# Patient Record
Sex: Female | Born: 1975 | Race: White | Hispanic: No | Marital: Married | Smoking: Never smoker
Health system: Southern US, Community
[De-identification: ages and names within clinical notes are randomized; demographics above are authoritative.]

## PROBLEM LIST (undated history)

## (undated) DIAGNOSIS — K589 Irritable bowel syndrome without diarrhea: Secondary | ICD-10-CM

## (undated) DIAGNOSIS — F419 Anxiety disorder, unspecified: Secondary | ICD-10-CM

## (undated) HISTORY — PX: NO PAST SURGERIES: SHX2092

---

## 1998-01-16 ENCOUNTER — Other Ambulatory Visit: Admission: RE | Admit: 1998-01-16 | Discharge: 1998-01-16 | Payer: Self-pay | Admitting: Gynecology

## 1999-01-29 ENCOUNTER — Other Ambulatory Visit: Admission: RE | Admit: 1999-01-29 | Discharge: 1999-01-29 | Payer: Self-pay | Admitting: Gynecology

## 2000-11-23 ENCOUNTER — Inpatient Hospital Stay (HOSPITAL_COMMUNITY): Admission: AD | Admit: 2000-11-23 | Discharge: 2000-11-23 | Payer: Self-pay | Admitting: Obstetrics and Gynecology

## 2001-02-11 ENCOUNTER — Inpatient Hospital Stay (HOSPITAL_COMMUNITY): Admission: AD | Admit: 2001-02-11 | Discharge: 2001-02-14 | Payer: Self-pay | Admitting: Obstetrics and Gynecology

## 2001-02-15 ENCOUNTER — Encounter: Admission: RE | Admit: 2001-02-15 | Discharge: 2001-03-17 | Payer: Self-pay | Admitting: Obstetrics and Gynecology

## 2001-03-18 ENCOUNTER — Other Ambulatory Visit: Admission: RE | Admit: 2001-03-18 | Discharge: 2001-03-18 | Payer: Self-pay | Admitting: Obstetrics and Gynecology

## 2002-02-14 ENCOUNTER — Other Ambulatory Visit: Admission: RE | Admit: 2002-02-14 | Discharge: 2002-02-14 | Payer: Self-pay | Admitting: Obstetrics and Gynecology

## 2003-06-02 ENCOUNTER — Other Ambulatory Visit: Admission: RE | Admit: 2003-06-02 | Discharge: 2003-06-02 | Payer: Self-pay | Admitting: Obstetrics and Gynecology

## 2004-07-26 ENCOUNTER — Other Ambulatory Visit: Admission: RE | Admit: 2004-07-26 | Discharge: 2004-07-26 | Payer: Self-pay | Admitting: Obstetrics and Gynecology

## 2006-09-10 ENCOUNTER — Inpatient Hospital Stay (HOSPITAL_COMMUNITY): Admission: AD | Admit: 2006-09-10 | Discharge: 2006-09-12 | Payer: Self-pay | Admitting: Obstetrics and Gynecology

## 2011-01-23 LAB — ABO/RH: RH Type: POSITIVE

## 2011-01-23 LAB — ANTIBODY SCREEN: Antibody Screen: NEGATIVE

## 2011-01-23 LAB — HIV ANTIBODY (ROUTINE TESTING W REFLEX): HIV: NONREACTIVE

## 2011-02-08 ENCOUNTER — Encounter (HOSPITAL_COMMUNITY): Payer: Self-pay

## 2011-02-08 ENCOUNTER — Inpatient Hospital Stay (HOSPITAL_COMMUNITY)
Admission: AD | Admit: 2011-02-08 | Discharge: 2011-02-08 | Disposition: A | Discharge status: Home / Self Care | Payer: 59 | Source: Ambulatory Visit | Attending: Obstetrics and Gynecology | Admitting: Obstetrics and Gynecology

## 2011-02-08 DIAGNOSIS — O209 Hemorrhage in early pregnancy, unspecified: Secondary | ICD-10-CM | POA: Insufficient documentation

## 2011-02-08 DIAGNOSIS — O4690 Antepartum hemorrhage, unspecified, unspecified trimester: Secondary | ICD-10-CM

## 2011-02-08 LAB — ABO/RH: ABO/RH(D): A POS

## 2011-02-08 NOTE — ED Provider Notes (Signed)
Chief Complaint:  Vaginal Bleeding   Anita Oliver is  35 y.o. G1P0.  No LMP recorded. Patient is pregnant.. [redacted]w[redacted]d  Her pregnancy status is positive.  She presents complaining of Vaginal Bleeding  Onset is described as sudden at 3 am today, none since. Denies cramping or pain   Obstetrical/Gynecological History: OB History    Grav Para Term Preterm Abortions TAB SAB Ect Mult Living   1               Past Medical History: No past medical history on file.  Past Surgical History: No past surgical history on file.  Family History: No family history on file.  Social History: History  Substance Use Topics  . Smoking status: Not on file  . Smokeless tobacco: Not on file  . Alcohol Use: Not on file    Allergies:  Allergies  Allergen Reactions  . Codeine Hives    Childhood reaction    Prescriptions prior to admission  Medication Sig Dispense Refill  . acetaminophen (TYLENOL) 500 MG tablet Take 1,000 mg by mouth every 6 (six) hours as needed. For migraine       . ALPRAZolam (XANAX) 0.25 MG tablet Take 0.125 mg by mouth once. 1/2 tab for anxiety      . prenatal vitamin w/FE, FA (PRENATAL 1 + 1) 27-1 MG TABS Take 1 tablet by mouth at bedtime.          Review of Systems - Negative except what has been reviewed in the HPI  Physical Exam   Blood pressure 114/86, pulse 101, temperature 98.5 F (36.9 C), temperature source Oral, resp. rate 16, height 5\' 8"  (1.727 m), weight 99.519 kg (219 lb 6.4 oz).  General: General appearance - alert, well appearing, and in no distress, oriented to person, place, and time and overweight Mental status - alert, oriented to person, place, and time, normal mood, behavior, speech, dress, motor activity, and thought processes, affect appropriate to mood Abdomen - soft, nontender, nondistended, no masses or organomegaly Focused Gynecological Exam: VULVA: normal appearing vulva with no masses, tenderness or lesions, VAGINA: normal appearing  vagina with normal color and discharge, no lesions, scant blood in vault, CERVIX: normal appearing cervix without discharge or lesions, closed  MD consult: d/w Dr. Janee Morn, No formal US at this time. D/C home with bleeding precautions. FU in office next week. Call on-call provider if anymore heavy bleeding over wknd.  Labs: No results found for this or any previous visit (from the past 24 hour(s)). Imaging Studies:  Informal bedside US. Viable IUP. Probable large [facility]   Assessment: Bleeding during Pregnancy  Plan: Discharge home FU next week in office  SHORES,SUZANNE E. 02/08/2011,1:13 PM  Per Maylon Cos, pt's blood type checked prior to d/c.  She is A+.

## 2011-02-08 NOTE — Progress Notes (Signed)
Onset of vaginal bleeding this morning gush of blood, had [place] about 6 weeks, denies cramping.

## 2011-05-13 NOTE — L&D Delivery Note (Signed)
Pt rapidly progressed to C/C/+2. The second stage only lasted 5 mins. She had a SVD of one live viable white female infant over intact perineum in ROA position. Placenta S/I. EBL-400cc. Baby to NBN. No complications.

## 2011-07-29 LAB — STREP B DNA PROBE: GBS: NEGATIVE

## 2011-08-16 ENCOUNTER — Inpatient Hospital Stay (HOSPITAL_COMMUNITY)
Admission: AD | Admit: 2011-08-16 | Discharge: 2011-08-18 | DRG: 775 | Disposition: A | Discharge status: Home / Self Care | Payer: 59 | Source: Ambulatory Visit | Attending: Obstetrics and Gynecology | Admitting: Obstetrics and Gynecology

## 2011-08-16 ENCOUNTER — Encounter (HOSPITAL_COMMUNITY): Payer: Self-pay | Admitting: Anesthesiology

## 2011-08-16 ENCOUNTER — Inpatient Hospital Stay (HOSPITAL_COMMUNITY): Payer: 59 | Admitting: Anesthesiology

## 2011-08-16 ENCOUNTER — Encounter (HOSPITAL_COMMUNITY): Payer: Self-pay | Admitting: *Deleted

## 2011-08-16 HISTORY — DX: Anxiety disorder, unspecified: F41.9

## 2011-08-16 HISTORY — DX: Irritable bowel syndrome, unspecified: K58.9

## 2011-08-16 LAB — RPR: RPR Ser Ql: NONREACTIVE

## 2011-08-16 LAB — CBC
HCT: 39.1 % (ref 36.0–46.0)
Hemoglobin: 13.3 g/dL (ref 12.0–15.0)
MCH: 30.8 pg (ref 26.0–34.0)
MCHC: 34 g/dL (ref 30.0–36.0)
RBC: 4.32 MIL/uL (ref 3.87–5.11)

## 2011-08-16 MED ORDER — LACTATED RINGERS IV SOLN
500.0000 mL | INTRAVENOUS | Status: DC | PRN
  Administered 2011-08-16: 1000 mL via INTRAVENOUS

## 2011-08-16 MED ORDER — ACETAMINOPHEN 325 MG PO TABS: 650.0000 mg | ORAL_TABLET | ORAL | Status: DC | PRN

## 2011-08-16 MED ORDER — FENTANYL 2.5 MCG/ML BUPIVACAINE 1/10 % EPIDURAL INFUSION (WH - ANES)
INTRAMUSCULAR | Status: DC | PRN
  Administered 2011-08-16: 14 mL/h via EPIDURAL

## 2011-08-16 MED ORDER — LIDOCAINE HCL (PF) 1 % IJ SOLN
INTRAMUSCULAR | Status: DC | PRN
  Administered 2011-08-16 (×2): 4 mL

## 2011-08-16 MED ORDER — ZOLPIDEM TARTRATE 5 MG PO TABS: 5.0000 mg | ORAL_TABLET | Freq: Every evening | ORAL | Status: DC | PRN

## 2011-08-16 MED ORDER — LACTATED RINGERS IV SOLN
INTRAVENOUS | Status: DC
  Administered 2011-08-16 (×2): via INTRAVENOUS

## 2011-08-16 MED ORDER — EPHEDRINE 5 MG/ML INJ
10.0000 mg | INTRAVENOUS | Status: DC | PRN
  Filled 2011-08-16: qty 4

## 2011-08-16 MED ORDER — LACTATED RINGERS IV SOLN: 500.0000 mL | Freq: Once | INTRAVENOUS | Status: DC

## 2011-08-16 MED ORDER — CITRIC ACID-SODIUM CITRATE 334-500 MG/5ML PO SOLN: 30.0000 mL | ORAL | Status: DC | PRN

## 2011-08-16 MED ORDER — SIMETHICONE 80 MG PO CHEW: 80.0000 mg | CHEWABLE_TABLET | ORAL | Status: DC | PRN

## 2011-08-16 MED ORDER — OXYTOCIN 20 UNITS IN LACTATED RINGERS INFUSION - SIMPLE
125.0000 mL/h | Freq: Once | INTRAVENOUS | Status: AC
  Administered 2011-08-16: 125 mL/h via INTRAVENOUS

## 2011-08-16 MED ORDER — FLEET ENEMA 7-19 GM/118ML RE ENEM: 1.0000 | ENEMA | RECTAL | Status: DC | PRN

## 2011-08-16 MED ORDER — TETANUS-DIPHTH-ACELL PERTUSSIS 5-2.5-18.5 LF-MCG/0.5 IM SUSP: 0.5000 mL | Freq: Once | INTRAMUSCULAR | Status: DC

## 2011-08-16 MED ORDER — ONDANSETRON HCL 4 MG PO TABS: 4.0000 mg | ORAL_TABLET | ORAL | Status: DC | PRN

## 2011-08-16 MED ORDER — MEASLES, MUMPS & RUBELLA VAC ~~LOC~~ INJ
0.5000 mL | INJECTION | Freq: Once | SUBCUTANEOUS | Status: DC
  Filled 2011-08-16: qty 0.5

## 2011-08-16 MED ORDER — OXYTOCIN BOLUS FROM INFUSION
500.0000 mL | Freq: Once | INTRAVENOUS | Status: DC
  Filled 2011-08-16: qty 1000
  Filled 2011-08-16: qty 500

## 2011-08-16 MED ORDER — FENTANYL 2.5 MCG/ML BUPIVACAINE 1/10 % EPIDURAL INFUSION (WH - ANES)
14.0000 mL/h | INTRAMUSCULAR | Status: DC
  Filled 2011-08-16: qty 60

## 2011-08-16 MED ORDER — DIPHENHYDRAMINE HCL 50 MG/ML IJ SOLN: 12.5000 mg | INTRAMUSCULAR | Status: DC | PRN

## 2011-08-16 MED ORDER — IBUPROFEN 600 MG PO TABS: 600.0000 mg | ORAL_TABLET | Freq: Four times a day (QID) | ORAL | Status: DC | PRN

## 2011-08-16 MED ORDER — EPHEDRINE 5 MG/ML INJ: 10.0000 mg | INTRAVENOUS | Status: DC | PRN

## 2011-08-16 MED ORDER — WITCH HAZEL-GLYCERIN EX PADS: 1.0000 "application " | MEDICATED_PAD | CUTANEOUS | Status: DC | PRN

## 2011-08-16 MED ORDER — PHENYLEPHRINE 40 MCG/ML (10ML) SYRINGE FOR IV PUSH (FOR BLOOD PRESSURE SUPPORT)
80.0000 ug | PREFILLED_SYRINGE | INTRAVENOUS | Status: AC | PRN
  Administered 2011-08-16: 80 ug via INTRAVENOUS
  Administered 2011-08-16 (×2): 120 ug via INTRAVENOUS
  Filled 2011-08-16 (×2): qty 5
  Filled 2011-08-16: qty 10

## 2011-08-16 MED ORDER — PHENYLEPHRINE 40 MCG/ML (10ML) SYRINGE FOR IV PUSH (FOR BLOOD PRESSURE SUPPORT)
80.0000 ug | PREFILLED_SYRINGE | INTRAVENOUS | Status: AC | PRN
  Administered 2011-08-16 (×3): 80 ug via INTRAVENOUS

## 2011-08-16 MED ORDER — ONDANSETRON HCL 4 MG/2ML IJ SOLN: 4.0000 mg | INTRAMUSCULAR | Status: DC | PRN

## 2011-08-16 MED ORDER — ONDANSETRON HCL 4 MG/2ML IJ SOLN: 4.0000 mg | Freq: Four times a day (QID) | INTRAMUSCULAR | Status: DC | PRN

## 2011-08-16 MED ORDER — DIBUCAINE 1 % RE OINT: 1.0000 "application " | TOPICAL_OINTMENT | RECTAL | Status: DC | PRN

## 2011-08-16 MED ORDER — OXYCODONE-ACETAMINOPHEN 5-325 MG PO TABS: 1.0000 | ORAL_TABLET | ORAL | Status: DC | PRN

## 2011-08-16 MED ORDER — IBUPROFEN 600 MG PO TABS
600.0000 mg | ORAL_TABLET | Freq: Four times a day (QID) | ORAL | Status: DC
  Administered 2011-08-16 – 2011-08-18 (×7): 600 mg via ORAL
  Filled 2011-08-16 (×7): qty 1

## 2011-08-16 MED ORDER — BENZOCAINE-MENTHOL 20-0.5 % EX AERO: 1.0000 "application " | INHALATION_SPRAY | CUTANEOUS | Status: DC | PRN

## 2011-08-16 MED ORDER — LIDOCAINE HCL (PF) 1 % IJ SOLN: 30.0000 mL | INTRAMUSCULAR | Status: DC | PRN

## 2011-08-16 NOTE — Anesthesia Postprocedure Evaluation (Signed)
  Anesthesia Post-op Note  Patient: Anita Oliver  Procedure(s) Performed: * No procedures listed *  Patient Location: PACU and Mother/Baby  Anesthesia Type: Epidural  Level of Consciousness: awake, alert  and oriented  Airway and Oxygen Therapy: Patient Spontanous Breathing   Post-op Assessment: Patient's Cardiovascular Status Stable and Respiratory Function Stable  Post-op Vital Signs: stable  Complications: No apparent anesthesia complications

## 2011-08-16 NOTE — MAU Note (Signed)
[employer] fund[employer] to place IV. Pt jerked back each time and began to have panic attack. Pt decided she would labor without epidural as she had fast labor last delivery. Will take to [facility] and give pt time to try to relax.

## 2011-08-16 NOTE — MAU Note (Signed)
Leaking fld since 0430. Contractions started about the same time.

## 2011-08-16 NOTE — Progress Notes (Signed)
Epidural paused due to pt low BPs and fetus not tolerating well with prolonged decels

## 2011-08-16 NOTE — Anesthesia Preprocedure Evaluation (Signed)
Anesthesia Evaluation  Patient identified by MRN, date of birth, ID band Patient awake    Reviewed: Allergy & Precautions, H&P , Patient's Chart, lab work & pertinent test results  Airway Mallampati: III TM Distance: >3 FB Neck ROM: full    Dental No notable dental hx. (+) Teeth Intact   Pulmonary neg pulmonary ROS,  breath sounds clear to auscultation  Pulmonary exam normal       Cardiovascular negative cardio ROS  Rhythm:regular Rate:Normal     Neuro/Psych Anxietynegative neurological ROS     GI/Hepatic negative GI ROS, Neg liver ROS, IBS    Endo/Other  Morbid obesity  Renal/GU negative Renal ROS  negative genitourinary   Musculoskeletal   Abdominal Normal abdominal exam  (+)   Peds  Hematology negative hematology ROS (+)   Anesthesia Other Findings   Reproductive/Obstetrics (+) Pregnancy                           Anesthesia Physical Anesthesia Plan  ASA: III  Anesthesia Plan: Epidural   Post-op Pain Management:    Induction:   Airway Management Planned:   Additional Equipment:   Intra-op Plan:   Post-operative Plan:   Informed Consent: I have reviewed the patients History and Physical, chart, labs and discussed the procedure including the risks, benefits and alternatives for the proposed anesthesia with the patient or authorized representative who has indicated his/her understanding and acceptance.     Plan Discussed with: Anesthesiologist  Anesthesia Plan Comments:         Anesthesia Quick Evaluation

## 2011-08-16 NOTE — Progress Notes (Signed)
Dr Tenny Craw notified of pt's admission and status. Aware of SROM at 0430, sl yellow fld, fhr reassuring but not reactive yet

## 2011-08-16 NOTE — Progress Notes (Signed)
Anesthesia at bedside--epidural restarted 

## 2011-08-16 NOTE — Anesthesia Procedure Notes (Signed)
Epidural Patient location during procedure: OB Start time: 08/16/2011 8:54 AM  Staffing Anesthesiologist: Harlowe Dowler A. Performed by: anesthesiologist   Preanesthetic Checklist Completed: patient identified, site marked, surgical consent, pre-op evaluation, timeout performed, IV checked, risks and benefits discussed and monitors and equipment checked  Epidural Patient position: sitting Prep: site prepped and draped and DuraPrep Patient monitoring: continuous pulse ox and blood pressure Approach: midline Injection technique: LOR air  Needle:  Needle type: Tuohy  Needle gauge: 17 G Needle length: 9 cm Needle insertion depth: 6 cm Catheter type: closed end flexible Catheter size: 19 Gauge Catheter at skin depth: 11 cm Test dose: negative and Other  Assessment Events: blood not aspirated, injection not painful, no injection resistance, negative IV test and no paresthesia  Additional Notes Patient identified. Risks and benefits discussed including failed block, incomplete  Pain control, post dural puncture headache, nerve damage, paralysis, blood pressure Changes, nausea, vomiting, reactions to medications-both toxic and allergic and post Partum back pain. All questions were answered. Patient expressed understanding and wished to proceed. Sterile technique was used throughout procedure. Epidural site was Dressed with sterile barrier dressing. No paresthesias, signs of intravascular injection Or signs of intrathecal spread were encountered.  Patient was more comfortable after the epidural was dosed. Please see RN's note for documentation of vital signs and FHR which are stable.

## 2011-08-16 NOTE — MAU Note (Signed)
Report called to Christy RN in BS 

## 2011-08-16 NOTE — H&P (Signed)
Pt is a 36 year old white female, G3P2002 at term who presents to L&D because of SROM. PNC was uncomplicated. Pt with +pool in ER. PMHx: see hollister PE: obese white female in NAD Abd- gravid, palp contractions Cx- 50/4/-2 / vtx per nurse. IMP/ IUP in labor Plan/ admit.

## 2011-08-17 LAB — CBC
HCT: 32.9 % — ABNORMAL LOW (ref 36.0–46.0)
Hemoglobin: 10.9 g/dL — ABNORMAL LOW (ref 12.0–15.0)
MCHC: 33.1 g/dL (ref 30.0–36.0)
WBC: 13.8 10*3/uL — ABNORMAL HIGH (ref 4.0–10.5)

## 2011-08-17 NOTE — Progress Notes (Signed)
PPD#1 Pt without c/o. Lochia-wnl. Wants circ. VSSAF Abd-soft IMP/ doing well Plan/ routine care.

## 2011-08-17 NOTE — Progress Notes (Signed)
Received referral for LCSW consult for history of anxiety and postpartum depression.  Care provider has given Rx for Xanax for anxiety management.  Care team encouraged to follow up with LCSW for services if needed.  Staci Acosta, LCSW 08/17/2011, 10:02 am

## 2011-08-18 MED ORDER — IBUPROFEN 600 MG PO TABS: 600.0000 mg | ORAL_TABLET | Freq: Four times a day (QID) | ORAL | Status: AC

## 2011-08-18 NOTE — Discharge Summary (Signed)
Obstetric Discharge Summary Reason for Admission: rupture of membranes Prenatal Procedures: none Intrapartum Procedures: spontaneous vaginal delivery Postpartum Procedures: none Complications-Operative and Postpartum: none Hemoglobin  Date Value Range Status  08/17/2011 10.9* 12.0-15.0 (g/dL) Final     DELTA CHECK NOTED     REPEATED TO VERIFY     HCT  Date Value Range Status  08/17/2011 32.9* 36.0-46.0 (%) Final    Physical Exam:  General: alert and cooperative Lochia: appropriate Uterine Fundus: firm  DVT Evaluation: No evidence of DVT seen on physical exam.  Discharge Diagnoses: Term Pregnancy-delivered  Discharge Information: Date: 08/18/2011 Activity: pelvic rest Diet: routine Medications: PNV and Ibuprofen Condition: stable Instructions: refer to practice specific booklet Discharge to: home Follow-up Information    Follow up with Levi Aland, MD in 4 weeks.   Contact information:   [address] [place] [address] [phone]          Newborn Data:   Kamiah, Fite [[id number]]  Live born female  Birth Weight:  APGAR: ,    Princesa, Willig [[id number]]  Live born female  Birth Weight: 9 lb 4.2 oz (4200 g) APGAR: 8, 9  Home with mother.  Philip Aspen 08/18/2011, 8:41 AM

## 2014-03-13 ENCOUNTER — Encounter (HOSPITAL_COMMUNITY): Payer: Self-pay | Admitting: *Deleted

## 2015-04-25 ENCOUNTER — Other Ambulatory Visit: Payer: Self-pay | Admitting: General Surgery

## 2015-05-21 ENCOUNTER — Encounter (HOSPITAL_COMMUNITY): Payer: Self-pay

## 2015-05-21 ENCOUNTER — Encounter (HOSPITAL_COMMUNITY)
Admission: RE | Admit: 2015-05-21 | Discharge: 2015-05-21 | Disposition: A | Discharge status: Home / Self Care | Payer: 59 | Source: Ambulatory Visit | Attending: General Surgery | Admitting: General Surgery

## 2015-05-21 DIAGNOSIS — K8010 Calculus of gallbladder with chronic cholecystitis without obstruction: Secondary | ICD-10-CM | POA: Diagnosis not present

## 2015-05-21 DIAGNOSIS — Z79899 Other long term (current) drug therapy: Secondary | ICD-10-CM | POA: Diagnosis not present

## 2015-05-21 DIAGNOSIS — K8020 Calculus of gallbladder without cholecystitis without obstruction: Secondary | ICD-10-CM | POA: Diagnosis present

## 2015-05-21 DIAGNOSIS — F419 Anxiety disorder, unspecified: Secondary | ICD-10-CM | POA: Diagnosis not present

## 2015-05-21 LAB — CBC
HCT: 40 % (ref 36.0–46.0)
HEMOGLOBIN: 13.1 g/dL (ref 12.0–15.0)
MCH: 29.6 pg (ref 26.0–34.0)
MCHC: 32.8 g/dL (ref 30.0–36.0)
MCV: 90.3 fL (ref 78.0–100.0)
Platelets: 309 10*3/uL (ref 150–400)
RBC: 4.43 MIL/uL (ref 3.87–5.11)
RDW: 13.4 % (ref 11.5–15.5)
WBC: 6.6 10*3/uL (ref 4.0–10.5)

## 2015-05-21 LAB — COMPREHENSIVE METABOLIC PANEL
ALK PHOS: 48 U/L (ref 38–126)
ALT: 14 U/L (ref 14–54)
ANION GAP: 8 (ref 5–15)
AST: 18 U/L (ref 15–41)
Albumin: 3.6 g/dL (ref 3.5–5.0)
BILIRUBIN TOTAL: 0.4 mg/dL (ref 0.3–1.2)
BUN: 8 mg/dL (ref 6–20)
CALCIUM: 9.3 mg/dL (ref 8.9–10.3)
CO2: 23 mmol/L (ref 22–32)
Chloride: 107 mmol/L (ref 101–111)
Creatinine, Ser: 0.83 mg/dL (ref 0.44–1.00)
Glucose, Bld: 85 mg/dL (ref 65–99)
POTASSIUM: 3.9 mmol/L (ref 3.5–5.1)
Sodium: 138 mmol/L (ref 135–145)
TOTAL PROTEIN: 6.7 g/dL (ref 6.5–8.1)

## 2015-05-21 LAB — HCG, SERUM, QUALITATIVE: PREG SERUM: NEGATIVE

## 2015-05-21 NOTE — Pre-Procedure Instructions (Signed)
Dianne DunCarrie M Leever  05/21/2015      [facility] - [place], [place] - [address] [facility] [id number] Phone: [phone] Fax: [phone]    Your procedure is scheduled on   Thursday  05/24/15  [facility] at 530 A.M.  Call this number if you have problems the morning of surgery:  [phone]   Remember:  Do not eat food or drink liquids after midnight.  Take these medicines the morning of surgery with A SIP OF WATER   ALBUTEROL, ALPRAZOLAM IF NEEDED, KURVELO, SERTRALINE  (STOP ASPIRIN, COUMADIN, PLAVIX, EFFIENT, HERBAL MEDICINES, VITAMINS)   Do not wear jewelry, make-up or nail polish.  Do not wear lotions, powders, or perfumes.  You may wear deodorant.  Do not shave 48 hours prior to surgery.  Men may shave face and neck.  Do not bring valuables to the hospital.  [facility] is not responsible for any belongings or valuables.  Contacts, dentures or bridgework may not be worn into surgery.  Leave your suitcase in the car.  After surgery it may be brought to your room.  For patients admitted to the hospital, discharge time will be determined by your treatment team.  Patients discharged the day of surgery will not be allowed to drive home.   Name and phone number of your driver:    Special instructions:  [facility] - Preparing for Surgery  Before surgery, you can play an important role.  Because skin is not sterile, your skin needs to be as free of germs as possible.  You can reduce the number of germs on you skin by washing with CHG (chlorahexidine gluconate) soap before surgery.  CHG is an antiseptic cleaner which kills germs and bonds with the skin to continue killing germs even after washing.  Please DO NOT use if you have an allergy to CHG or antibacterial soaps.  If your skin becomes reddened/irritated stop using the CHG and inform your nurse when you arrive at Short Stay.  Do not shave (including legs  and underarms) for at least 48 hours prior to the first CHG shower.  You may shave your face.  Please follow these instructions carefully:   1.  Shower with CHG Soap the night before surgery and the                                morning of Surgery.  2.  If you choose to wash your hair, wash your hair first as usual with your       normal shampoo.  3.  After you shampoo, rinse your hair and body thoroughly to remove the                      Shampoo.  4.  Use CHG as you would any other liquid soap.  You can apply chg directly       to the skin and wash gently with scrungie or a clean washcloth.  5.  Apply the CHG Soap to your body ONLY FROM THE NECK DOWN.        Do not use on open wounds or open sores.  Avoid contact with your eyes,       ears, mouth and genitals (private parts).  Wash genitals (private parts)       with your normal soap.  6.  Wash thoroughly, paying special attention to the area where your surgery        will be performed.  7.  Thoroughly rinse your body with warm water from the neck down.  8.  DO NOT shower/wash with your normal soap after using and rinsing off       the CHG Soap.  9.  Pat yourself dry with a clean towel.            10.  Wear clean pajamas.            11.  Place clean sheets on your bed the night of your first shower and do not        sleep with pets.  Day of Surgery  Do not apply any lotions/deoderants the morning of surgery.  Please wear clean clothes to the hospital/surgery center.    Please read over the following fact sheets that you were given. Pain Booklet, Coughing and Deep Breathing and Surgical Site Infection Prevention

## 2015-05-24 ENCOUNTER — Ambulatory Visit (HOSPITAL_COMMUNITY): Payer: 59 | Admitting: Certified Registered Nurse Anesthetist

## 2015-05-24 ENCOUNTER — Encounter (HOSPITAL_COMMUNITY)
Admission: RE | Disposition: A | Discharge status: Home / Self Care | Payer: Self-pay | Source: Ambulatory Visit | Attending: General Surgery

## 2015-05-24 ENCOUNTER — Ambulatory Visit (HOSPITAL_COMMUNITY): Payer: 59

## 2015-05-24 ENCOUNTER — Ambulatory Visit (HOSPITAL_COMMUNITY)
Admission: RE | Admit: 2015-05-24 | Discharge: 2015-05-24 | Disposition: A | Discharge status: Home / Self Care | Payer: 59 | Source: Ambulatory Visit | Attending: General Surgery | Admitting: General Surgery

## 2015-05-24 ENCOUNTER — Encounter (HOSPITAL_COMMUNITY): Payer: Self-pay | Admitting: *Deleted

## 2015-05-24 DIAGNOSIS — F419 Anxiety disorder, unspecified: Secondary | ICD-10-CM | POA: Insufficient documentation

## 2015-05-24 DIAGNOSIS — K8020 Calculus of gallbladder without cholecystitis without obstruction: Secondary | ICD-10-CM

## 2015-05-24 DIAGNOSIS — Z79899 Other long term (current) drug therapy: Secondary | ICD-10-CM | POA: Insufficient documentation

## 2015-05-24 DIAGNOSIS — K8010 Calculus of gallbladder with chronic cholecystitis without obstruction: Secondary | ICD-10-CM | POA: Diagnosis not present

## 2015-05-24 HISTORY — PX: CHOLECYSTECTOMY: SHX55

## 2015-05-24 SURGERY — LAPAROSCOPIC CHOLECYSTECTOMY
Anesthesia: General | Site: Abdomen

## 2015-05-24 MED ORDER — LACTATED RINGERS IV SOLN
INTRAVENOUS | Status: DC | PRN
  Administered 2015-05-24 (×2): via INTRAVENOUS

## 2015-05-24 MED ORDER — LIDOCAINE HCL (CARDIAC) 20 MG/ML IV SOLN
INTRAVENOUS | Status: DC | PRN
  Administered 2015-05-24: 100 mg via INTRAVENOUS

## 2015-05-24 MED ORDER — PROPOFOL 10 MG/ML IV BOLUS
INTRAVENOUS | Status: DC | PRN
  Administered 2015-05-24: 150 mg via INTRAVENOUS

## 2015-05-24 MED ORDER — BUPIVACAINE-EPINEPHRINE (PF) 0.25% -1:200000 IJ SOLN
INTRAMUSCULAR | Status: AC
Start: 2015-05-24 — End: 2015-05-24
  Filled 2015-05-24: qty 30

## 2015-05-24 MED ORDER — SODIUM CHLORIDE 0.9 % IR SOLN
Status: DC | PRN
  Administered 2015-05-24: 1000 mL

## 2015-05-24 MED ORDER — ROCURONIUM BROMIDE 50 MG/5ML IV SOLN
INTRAVENOUS | Status: AC
  Filled 2015-05-24: qty 1

## 2015-05-24 MED ORDER — SUGAMMADEX SODIUM 200 MG/2ML IV SOLN
INTRAVENOUS | Status: DC | PRN
  Administered 2015-05-24: 200 mg via INTRAVENOUS

## 2015-05-24 MED ORDER — PROPOFOL 10 MG/ML IV BOLUS
INTRAVENOUS | Status: AC
  Filled 2015-05-24: qty 40

## 2015-05-24 MED ORDER — SODIUM CHLORIDE 0.9 % IV SOLN
INTRAVENOUS | Status: DC | PRN
  Administered 2015-05-24: 4 mL

## 2015-05-24 MED ORDER — ROCURONIUM BROMIDE 100 MG/10ML IV SOLN
INTRAVENOUS | Status: DC | PRN
  Administered 2015-05-24: 50 mg via INTRAVENOUS

## 2015-05-24 MED ORDER — ONDANSETRON HCL 4 MG/2ML IJ SOLN
INTRAMUSCULAR | Status: AC
Start: 2015-05-24 — End: 2015-05-24
  Filled 2015-05-24: qty 2

## 2015-05-24 MED ORDER — BUPIVACAINE-EPINEPHRINE 0.25% -1:200000 IJ SOLN
INTRAMUSCULAR | Status: DC | PRN
  Administered 2015-05-24: 23 mL

## 2015-05-24 MED ORDER — ESMOLOL HCL 100 MG/10ML IV SOLN
INTRAVENOUS | Status: DC | PRN
  Administered 2015-05-24: 30 mg via INTRAVENOUS

## 2015-05-24 MED ORDER — OXYCODONE-ACETAMINOPHEN 5-325 MG PO TABS: 1.0000 | ORAL_TABLET | ORAL | Status: DC | PRN

## 2015-05-24 MED ORDER — CHLORHEXIDINE GLUCONATE 4 % EX LIQD: 1.0000 "application " | Freq: Once | CUTANEOUS | Status: DC

## 2015-05-24 MED ORDER — PHENYLEPHRINE 40 MCG/ML (10ML) SYRINGE FOR IV PUSH (FOR BLOOD PRESSURE SUPPORT)
PREFILLED_SYRINGE | INTRAVENOUS | Status: AC
  Filled 2015-05-24: qty 10

## 2015-05-24 MED ORDER — CHLORHEXIDINE GLUCONATE 4 % EX LIQD
1.0000 | Freq: Once | CUTANEOUS | Status: DC
Start: 2015-05-24 — End: 2015-05-24

## 2015-05-24 MED ORDER — HYDROMORPHONE HCL 1 MG/ML IJ SOLN
INTRAMUSCULAR | Status: AC
  Administered 2015-05-24: 0.5 mg via INTRAVENOUS
  Filled 2015-05-24: qty 1

## 2015-05-24 MED ORDER — ONDANSETRON HCL 4 MG/2ML IJ SOLN: 4.0000 mg | Freq: Once | INTRAMUSCULAR | Status: DC | PRN

## 2015-05-24 MED ORDER — FENTANYL CITRATE (PF) 250 MCG/5ML IJ SOLN
INTRAMUSCULAR | Status: AC
  Filled 2015-05-24: qty 5

## 2015-05-24 MED ORDER — ONDANSETRON HCL 4 MG/2ML IJ SOLN
INTRAMUSCULAR | Status: DC | PRN
  Administered 2015-05-24: 4 mg via INTRAVENOUS

## 2015-05-24 MED ORDER — MIDAZOLAM HCL 2 MG/2ML IJ SOLN
INTRAMUSCULAR | Status: AC
  Filled 2015-05-24: qty 2

## 2015-05-24 MED ORDER — SUGAMMADEX SODIUM 200 MG/2ML IV SOLN
INTRAVENOUS | Status: AC
  Filled 2015-05-24: qty 2

## 2015-05-24 MED ORDER — LIDOCAINE HCL (CARDIAC) 20 MG/ML IV SOLN
INTRAVENOUS | Status: AC
  Filled 2015-05-24: qty 5

## 2015-05-24 MED ORDER — HYDROMORPHONE HCL 1 MG/ML IJ SOLN
0.2500 mg | INTRAMUSCULAR | Status: DC | PRN
  Administered 2015-05-24 (×2): 0.5 mg via INTRAVENOUS

## 2015-05-24 MED ORDER — SUCCINYLCHOLINE CHLORIDE 20 MG/ML IJ SOLN
INTRAMUSCULAR | Status: AC
Start: 2015-05-24 — End: 2015-05-24
  Filled 2015-05-24: qty 1

## 2015-05-24 MED ORDER — FENTANYL CITRATE (PF) 100 MCG/2ML IJ SOLN
INTRAMUSCULAR | Status: DC | PRN
  Administered 2015-05-24 (×2): 25 ug via INTRAVENOUS
  Administered 2015-05-24: 150 ug via INTRAVENOUS
  Administered 2015-05-24: 100 ug via INTRAVENOUS

## 2015-05-24 MED ORDER — MIDAZOLAM HCL 5 MG/5ML IJ SOLN
INTRAMUSCULAR | Status: DC | PRN
  Administered 2015-05-24: 2 mg via INTRAVENOUS

## 2015-05-24 MED ORDER — CEFAZOLIN SODIUM-DEXTROSE 2-3 GM-% IV SOLR
2.0000 g | INTRAVENOUS | Status: AC
  Administered 2015-05-24: 2 g via INTRAVENOUS
  Filled 2015-05-24: qty 50

## 2015-05-24 MED ORDER — SODIUM CHLORIDE 0.9 % IJ SOLN
INTRAMUSCULAR | Status: AC
Start: 2015-05-24 — End: 2015-05-24
  Filled 2015-05-24: qty 10

## 2015-05-24 MED ORDER — TRAMADOL HCL 50 MG PO TABS
50.0000 mg | ORAL_TABLET | Freq: Four times a day (QID) | ORAL | Status: AC | PRN
Start: 2015-05-24 — End: ?

## 2015-05-24 MED ORDER — MEPERIDINE HCL 25 MG/ML IJ SOLN: 6.2500 mg | INTRAMUSCULAR | Status: DC | PRN

## 2015-05-24 MED ORDER — 0.9 % SODIUM CHLORIDE (POUR BTL) OPTIME
TOPICAL | Status: DC | PRN
  Administered 2015-05-24: 1000 mL

## 2015-05-24 MED ORDER — EPHEDRINE SULFATE 50 MG/ML IJ SOLN
INTRAMUSCULAR | Status: AC
  Filled 2015-05-24: qty 1

## 2015-05-24 SURGICAL SUPPLY — 37 items
APPLIER CLIP 5 13 M/L LIGAMAX5 (MISCELLANEOUS) ×3
APPLIER CLIP ROT 10 11.4 M/L (STAPLE) ×3
CANISTER SUCTION 2500CC (MISCELLANEOUS) ×3 IMPLANT
CATH REDDICK CHOLANGI 4FR 50CM (CATHETERS) ×3 IMPLANT
CHLORAPREP W/TINT 26ML (MISCELLANEOUS) ×3 IMPLANT
CLIP APPLIE 5 13 M/L LIGAMAX5 (MISCELLANEOUS) ×1 IMPLANT
CLIP APPLIE ROT 10 11.4 M/L (STAPLE) ×1 IMPLANT
COVER MAYO STAND STRL (DRAPES) ×3 IMPLANT
COVER SURGICAL LIGHT HANDLE (MISCELLANEOUS) ×3 IMPLANT
DRAPE C-ARM 42X72 X-RAY (DRAPES) ×3 IMPLANT
ELECT REM PT RETURN 9FT ADLT (ELECTROSURGICAL) ×3
ELECTRODE REM PT RTRN 9FT ADLT (ELECTROSURGICAL) ×1 IMPLANT
GLOVE BIO SURGEON STRL SZ7 (GLOVE) ×3 IMPLANT
GLOVE BIO SURGEON STRL SZ7.5 (GLOVE) ×3 IMPLANT
GLOVE BIOGEL PI IND STRL 7.0 (GLOVE) ×2 IMPLANT
GLOVE BIOGEL PI INDICATOR 7.0 (GLOVE) ×4
GLOVE SURG SS PI 7.0 STRL IVOR (GLOVE) ×3 IMPLANT
GOWN STRL REUS W/ TWL LRG LVL3 (GOWN DISPOSABLE) ×3 IMPLANT
GOWN STRL REUS W/TWL LRG LVL3 (GOWN DISPOSABLE) ×6
IV CATH 14GX2 1/4 (CATHETERS) ×3 IMPLANT
KIT BASIN OR (CUSTOM PROCEDURE TRAY) ×3 IMPLANT
KIT ROOM TURNOVER OR (KITS) ×3 IMPLANT
LIQUID BAND (GAUZE/BANDAGES/DRESSINGS) ×3 IMPLANT
NS IRRIG 1000ML POUR BTL (IV SOLUTION) ×3 IMPLANT
PAD ARMBOARD 7.5X6 YLW CONV (MISCELLANEOUS) ×3 IMPLANT
POUCH SPECIMEN RETRIEVAL 10MM (ENDOMECHANICALS) ×3 IMPLANT
SCISSORS LAP 5X35 DISP (ENDOMECHANICALS) ×3 IMPLANT
SET IRRIG TUBING LAPAROSCOPIC (IRRIGATION / IRRIGATOR) ×3 IMPLANT
SLEEVE ENDOPATH XCEL 5M (ENDOMECHANICALS) ×6 IMPLANT
SPECIMEN JAR SMALL (MISCELLANEOUS) ×3 IMPLANT
SUT MNCRL AB 4-0 PS2 18 (SUTURE) ×3 IMPLANT
TOWEL OR 17X26 10 PK STRL BLUE (TOWEL DISPOSABLE) ×3 IMPLANT
TRAY LAPAROSCOPIC MC (CUSTOM PROCEDURE TRAY) ×3 IMPLANT
TROCAR XCEL BLUNT TIP 100MML (ENDOMECHANICALS) ×3 IMPLANT
TROCAR XCEL NON-BLD 11X100MML (ENDOMECHANICALS) ×3 IMPLANT
TROCAR XCEL NON-BLD 5MMX100MML (ENDOMECHANICALS) ×3 IMPLANT
TUBING INSUFFLATION (TUBING) ×3 IMPLANT

## 2015-05-24 NOTE — H&P (Signed)
Anita Oliver  [facility] Patient #: [id number] DOB: 1975/07/27 Married / Language: English / Race: White Female   History of Present Illness  The patient is a 40 year old female who presents with abdominal pain. We are asked to see the patient in consultation by Dr. Egbert GaribaldiSlatosky to evaluate her for gallstones. The patient is a 40 year old white female who began having right upper quadrant pain in August. During the fall her episodes of pain were occurring about every 8-10 days. The pain in the right upper quadrant would radiate to her back. The pain was associated with significant nausea and vomiting. She also has some occasional diarrhea with it. She underwent ultrasound evaluation which did show a stone in the gallbladder with minimal gallbladder wall thickening and no ductal dilatation. Liver functions were not performed   Other Problems Anxiety Disorder General anesthesia - complications  Past Surgical History  No pertinent past surgical history  Diagnostic Studies History  Colonoscopy never Mammogram never Pap Smear 1-5 years ago  Allergies  Codeine Sulfate *ANALGESICS - OPIOID* Headache, Hives.  Medication History Xanax (0.25MG  Tablet, Oral) Active. Zoloft (50MG  Tablet, Oral daily) Active. Medications Reconciled  Social History  Alcohol use Occasional alcohol use. Caffeine use Carbonated beverages, Tea. No drug use Tobacco use Never smoker.  Family History  Arthritis Father, Mother. Colon Polyps Father, Mother. Ischemic Bowel Disease Mother. Melanoma Mother. Thyroid problems Father.  Pregnancy / Birth History  Age at menarche 14 years. Contraceptive History Oral contraceptives. Gravida 3 Maternal age 38-25 Para 3 Regular periods    Review of Systems  General Present- Fatigue and Weight Gain. Not Present- Appetite Loss, Chills, Fever, Night Sweats and Weight Loss. Skin Not Present- Change in  Wart/Mole, Dryness, Hives, Jaundice, New Lesions, Non-Healing Wounds, Rash and Ulcer. HEENT Present- Seasonal Allergies. Not Present- Earache, Hearing Loss, Hoarseness, Nose Bleed, Oral Ulcers, Ringing in the Ears, Sinus Pain, Sore Throat, Visual Disturbances, Wears glasses/contact lenses and Yellow Eyes. Respiratory Not Present- Bloody sputum, Chronic Cough, Difficulty Breathing, Snoring and Wheezing. Breast Not Present- Breast Mass, Breast Pain, Nipple Discharge and Skin Changes. Cardiovascular Not Present- Chest Pain, Difficulty Breathing Lying Down, Leg Cramps, Palpitations, Rapid Heart Rate, Shortness of Breath and Swelling of Extremities. Gastrointestinal Present- Abdominal Pain, Change in Bowel Habits, Chronic diarrhea and Gets full quickly at meals. Not Present- Bloating, Bloody Stool, Constipation, Difficulty Swallowing, Excessive gas, Hemorrhoids, Indigestion, Nausea, Rectal Pain and Vomiting. Female Genitourinary Not Present- Frequency, Nocturia, Painful Urination, Pelvic Pain and Urgency. Musculoskeletal Present- Back Pain. Not Present- Joint Pain, Joint Stiffness, Muscle Pain, Muscle Weakness and Swelling of Extremities. Neurological Not Present- Decreased Memory, Fainting, Headaches, Numbness, Seizures, Tingling, Tremor, Trouble walking and Weakness. Psychiatric Present- Anxiety. Not Present- Bipolar, Change in Sleep Pattern, Depression, Fearful and Frequent crying. Endocrine Not Present- Cold Intolerance, Excessive Hunger, Hair Changes, Heat Intolerance, Hot flashes and New Diabetes. Hematology Not Present- Easy Bruising, Excessive bleeding, Gland problems, HIV and Persistent Infections.  Vitals  Weight: 233 lb Height: 67in Body Surface Area: 2.16 m Body Mass Index: 36.49 kg/m  Temp.: 97.39F(Temporal)  Pulse: 88 (Regular)  BP: 130/70 (Sitting, Left Arm, Standard)       Physical Exam General Mental Status-Alert. General Appearance-Consistent with stated  age. Hydration-Well hydrated. Voice-Normal.  Head and Neck Head-normocephalic, atraumatic with no lesions or palpable masses. Trachea-midline. Thyroid Gland Characteristics - normal size and consistency.  Eye Eyeball - Bilateral-Extraocular movements intact. Sclera/Conjunctiva - Bilateral-No scleral icterus.  Chest and Lung Exam Chest and lung exam  reveals -quiet, even and easy respiratory effort with no use of accessory muscles and on auscultation, normal breath sounds, no adventitious sounds and normal vocal resonance. Inspection Chest Wall - Normal. Back - normal.  Cardiovascular Cardiovascular examination reveals -normal heart sounds, regular rate and rhythm with no murmurs and normal pedal pulses bilaterally.  Abdomen Note: The abdomen is soft with minimal tenderness in the right upper quadrant. There are no surgical scars. There is no palpable mass.   Neurologic Neurologic evaluation reveals -alert and oriented x 3 with no impairment of recent or remote memory. Mental Status-Normal.  Musculoskeletal Normal Exam - Left-Upper Extremity Strength Normal and Lower Extremity Strength Normal. Normal Exam - Right-Upper Extremity Strength Normal and Lower Extremity Strength Normal.  Lymphatic Head & Neck  General Head & Neck Lymphatics: Bilateral - Description - Normal. Axillary  General Axillary Region: Bilateral - Description - Normal. Tenderness - Non Tender. Femoral & Inguinal  Generalized Femoral & Inguinal Lymphatics: Bilateral - Description - Normal. Tenderness - Non Tender.    Assessment & Plan  GALLSTONES (K80.20) Impression: The patient appears to have symptomatic gallstones. Because of the risk of further painful episodes and possible pancreatitis think she would benefit from having her gallbladder removed. She would also like to have this done. I have discussed with her in detail the risks and benefits of the operation to remove the  gallbladder as well as some of the technical aspects and she understands and wishes to proceed Current Plans Pt Education - Gallstones: discussed with patient and provided information.   Signed by Caleen Essex, MD

## 2015-05-24 NOTE — Anesthesia Preprocedure Evaluation (Addendum)
Anesthesia Evaluation  Patient identified by MRN, date of birth, ID band Patient awake    Reviewed: Allergy & Precautions, NPO status , Patient's Chart, lab work & pertinent test results  Airway Mallampati: I  TM Distance: >3 FB Neck ROM: Full    Dental  (+) Dental Advisory Given, Teeth Intact   Pulmonary    Pulmonary exam normal        Cardiovascular Normal cardiovascular exam     Neuro/Psych Anxiety    GI/Hepatic   Endo/Other    Renal/GU      Musculoskeletal   Abdominal   Peds  Hematology   Anesthesia Other Findings   Reproductive/Obstetrics                           Anesthesia Physical Anesthesia Plan  ASA: II  Anesthesia Plan: General   Post-op Pain Management:    Induction: Intravenous  Airway Management Planned: Oral ETT  Additional Equipment:   Intra-op Plan:   Post-operative Plan: Extubation in OR  Informed Consent: I have reviewed the patients History and Physical, chart, labs and discussed the procedure including the risks, benefits and alternatives for the proposed anesthesia with the patient or authorized representative who has indicated his/her understanding and acceptance.     Plan Discussed with: CRNA and Surgeon  Anesthesia Plan Comments:         Anesthesia Quick Evaluation

## 2015-05-24 NOTE — Interval H&P Note (Signed)
History and Physical Interval Note:  05/24/2015 7:15 AM  Anita Oliver  has presented today for surgery, with the diagnosis of GALLSTONES  The various methods of treatment have been discussed with the patient and family. After consideration of risks, benefits and other options for treatment, the patient has consented to  Procedure(s): LAPAROSCOPIC CHOLECYSTECTOMY (N/A) with intraoperative cholangiogram as a surgical intervention .  The patient's history has been reviewed, patient examined, no change in status, stable for surgery.  I have reviewed the patient's chart and labs.  Questions were answered to the patient's satisfaction.     TOTH III,Jandel Patriarca S

## 2015-05-24 NOTE — Transfer of Care (Signed)
Immediate Anesthesia Transfer of Care Note  Patient: Anita Oliver  Procedure(s) Performed: Procedure(s): LAPAROSCOPIC CHOLECYSTECTOMY WITH INTRAOPERATIVE CHOLANGIOGRAM (N/A)  Patient Location: PACU  Anesthesia Type:General  Level of Consciousness: awake, alert , oriented and patient cooperative  Airway & Oxygen Therapy: Patient Spontanous Breathing and Patient connected to nasal cannula oxygen  Post-op Assessment: Report given to RN and Post -op Vital signs reviewed and stable  Post vital signs: Reviewed and stable  Last Vitals:  Filed Vitals:   05/24/15 0608 05/24/15 0853  BP: 145/88   Pulse: 93   Temp: 37 C 36.6 C  Resp: 20     Complications: No apparent anesthesia complications

## 2015-05-24 NOTE — Anesthesia Postprocedure Evaluation (Signed)
Anesthesia Post Note  Patient: Dianne DunCarrie M Eckhart  Procedure(s) Performed: Procedure(s) (LRB): LAPAROSCOPIC CHOLECYSTECTOMY WITH INTRAOPERATIVE CHOLANGIOGRAM (N/A)  Patient location during evaluation: PACU Anesthesia Type: General Level of consciousness: awake and alert Pain management: pain level controlled Vital Signs Assessment: post-procedure vital signs reviewed and stable Respiratory status: spontaneous breathing, nonlabored ventilation, respiratory function stable and patient connected to nasal cannula oxygen Cardiovascular status: blood pressure returned to baseline and stable Postop Assessment: no signs of nausea or vomiting Anesthetic complications: no    Last Vitals:  Filed Vitals:   05/24/15 1100 05/24/15 1101  BP:  135/68  Pulse: 105   Temp: 36.2 C   Resp: 14 14    Last Pain:  Filed Vitals:   05/24/15 1115  PainSc: 2                  Rohan Juenger DAVID

## 2015-05-24 NOTE — Op Note (Signed)
05/24/2015  8:41 AM  PATIENT:  Anita Oliver  40 y.o. female  PRE-OPERATIVE DIAGNOSIS:  GALLSTONES  POST-OPERATIVE DIAGNOSIS:  GALLSTONES  PROCEDURE:  Procedure(s): LAPAROSCOPIC CHOLECYSTECTOMY WITH INTRAOPERATIVE CHOLANGIOGRAM (N/A)  SURGEON:  Surgeon(s) and Role:    * Griselda Miner, MD - Primary  PHYSICIAN ASSISTANT:   ASSISTANTS: none   ANESTHESIA:   general  EBL:  Total I/O In: 1000 [I.V.:1000] Out: -   BLOOD ADMINISTERED:none  DRAINS: none   LOCAL MEDICATIONS USED:  MARCAINE     SPECIMEN:  Source of Specimen:  gallbladder  DISPOSITION OF SPECIMEN:  PATHOLOGY  COUNTS:  YES  TOURNIQUET:  * No tourniquets in log *  DICTATION: .Dragon Dictation   Procedure: After informed consent was obtained the patient was brought to the operating room and placed in the supine position on the operating room table. After adequate induction of general anesthesia the patient's abdomen was prepped with ChloraPrep allowed to dry and draped in usual sterile manner. The area below the umbilicus was infiltrated with quarter percent  Marcaine. A small incision was made with a 15 blade knife. The incision was carried down through the subcutaneous tissue bluntly with a hemostat and [employer] retractors. The linea alba was identified. The linea alba was incised with a 15 blade knife and each side was grasped with Coker clamps. The preperitoneal space was then probed with a hemostat until the peritoneum was opened and access was gained to the abdominal cavity. A 0 Vicryl pursestring stitch was placed in the fascia surrounding the opening. A Hassan cannula was then placed through the opening and anchored in place with the previously placed Vicryl purse string stitch. The abdomen was insufflated with carbon dioxide without difficulty. A laparoscope was inserted through the [facility] cannula in the right upper quadrant was inspected. Next the epigastric region was infiltrated with % Marcaine. A small  incision was made with a 15 blade knife. A 5 mm port was placed bluntly through this incision into the abdominal cavity under direct vision. Next 2 sites were chosen laterally on the right side of the abdomen for placement of 5 mm ports. Each of these areas was infiltrated with quarter percent Marcaine. Small stab incisions were made with a 15 blade knife. 5 mm ports were then placed bluntly through these incisions into the abdominal cavity under direct vision without difficulty. A blunt grasper was placed through the lateralmost 5 mm port and used to grasp the dome of the gallbladder and elevated anteriorly and superiorly. Another blunt grasper was placed through the other 5 mm port and used to retract the body and neck of the gallbladder. A dissector was placed through the epigastric port and using the electrocautery the peritoneal reflection at the gallbladder neck was opened. Blunt dissection was then carried out in this area until the gallbladder neck-cystic duct junction was readily identified and a good window was created. A single clip was placed on the gallbladder neck. A small  ductotomy was made just below the clip with laparoscopic scissors. A 14-gauge Angiocath was then placed through the anterior abdominal wall under direct vision. A Reddick cholangiogram catheter was then placed through the Angiocath and flushed. The catheter was then placed in the cystic duct and anchored in place with a clip. A cholangiogram was obtained that showed no filling defects good emptying into the duodenum an adequate length on the cystic duct. The anchoring clip and catheters were then removed from the patient. 3 clips were placed proximally on  the cystic duct and the duct was divided between the 2 sets of clips. Posterior to this the cystic artery was identified and again dissected bluntly in a circumferential manner until a good window  was created. 2 clips were placed proximally and one distally on the artery and the  artery was divided between the 2 sets of clips. Next a laparoscopic hook cautery device was used to separate the gallbladder from the liver bed. Prior to completely detaching the gallbladder from the liver bed the liver bed was inspected and several small bleeding points were coagulated with the electrocautery until the area was completely hemostatic. The gallbladder was then detached the rest of it from the liver bed without difficulty. A laparoscopic bag was inserted through the hassan port. The laparoscope was moved to the epigastric port. The gallbladder was placed within the bag and the bag was sealed.  The bag with the gallbladder was then removed with the [facility] cannula through the infraumbilical port without difficulty. The fascial defect was then closed with the previously placed Vicryl pursestring stitch as well as with another figure-of-eight 0 Vicryl stitch. The liver bed was inspected again and found to be hemostatic. The abdomen was irrigated with copious amounts of saline until the effluent was clear. The ports were then removed under direct vision without difficulty and were found to be hemostatic. The gas was allowed to escape. The skin incisions were all closed with interrupted 4-0 Monocryl subcuticular stitches. Dermabond dressings were applied. The patient tolerated the procedure well. At the end of the case all needle sponge and instrument counts were correct. The patient was then awakened and taken to recovery in stable condition  PLAN OF CARE: Discharge to home after PACU  PATIENT DISPOSITION:  PACU - hemodynamically stable.   Delay start of Pharmacological VTE agent (>24hrs) due to surgical blood loss or risk of bleeding: not applicable

## 2015-05-24 NOTE — Anesthesia Procedure Notes (Signed)
Procedure Name: Intubation Date/Time: 05/24/2015 7:36 AM Performed by: Faustino CongressWHITE, Kyarah Enamorado TENA Maylon Sailors Pre-anesthesia Checklist: Patient identified, Emergency Drugs available, Suction available and Patient being monitored Patient Re-evaluated:Patient Re-evaluated prior to inductionOxygen Delivery Method: Circle system utilized Preoxygenation: Pre-oxygenation with 100% oxygen Intubation Type: IV induction Ventilation: Mask ventilation without difficulty Laryngoscope Size: Mac and 3 Grade View: Grade I Tube type: Oral Tube size: 7.5 mm Number of attempts: 1 Airway Equipment and Method: Stylet Placement Confirmation: ETT inserted through vocal cords under direct vision,  positive ETCO2 and breath sounds checked- equal and bilateral Secured at: 22 cm Tube secured with: Tape Dental Injury: Teeth and Oropharynx as per pre-operative assessment

## 2015-05-25 ENCOUNTER — Encounter (HOSPITAL_COMMUNITY): Payer: Self-pay | Admitting: General Surgery

## 2015-12-11 ENCOUNTER — Other Ambulatory Visit: Payer: Self-pay | Admitting: Obstetrics and Gynecology

## 2015-12-12 LAB — CYTOLOGY - PAP

## 2017-08-16 ENCOUNTER — Other Ambulatory Visit: Payer: Self-pay

## 2017-08-16 ENCOUNTER — Emergency Department (HOSPITAL_COMMUNITY): Payer: 59

## 2017-08-16 ENCOUNTER — Encounter (HOSPITAL_COMMUNITY): Payer: Self-pay | Admitting: Emergency Medicine

## 2017-08-16 ENCOUNTER — Emergency Department (HOSPITAL_COMMUNITY)
Admission: EM | Admit: 2017-08-16 | Discharge: 2017-08-17 | Disposition: A | Discharge status: Home / Self Care | Payer: 59 | Attending: Emergency Medicine | Admitting: Emergency Medicine

## 2017-08-16 DIAGNOSIS — S62101A Fracture of unspecified carpal bone, right wrist, initial encounter for closed fracture: Secondary | ICD-10-CM | POA: Diagnosis not present

## 2017-08-16 DIAGNOSIS — Y93H9 Activity, other involving exterior property and land maintenance, building and construction: Secondary | ICD-10-CM | POA: Insufficient documentation

## 2017-08-16 DIAGNOSIS — S6991XA Unspecified injury of right wrist, hand and finger(s), initial encounter: Secondary | ICD-10-CM | POA: Diagnosis present

## 2017-08-16 DIAGNOSIS — Y998 Other external cause status: Secondary | ICD-10-CM | POA: Insufficient documentation

## 2017-08-16 DIAGNOSIS — W010XXA Fall on same level from slipping, tripping and stumbling without subsequent striking against object, initial encounter: Secondary | ICD-10-CM | POA: Insufficient documentation

## 2017-08-16 DIAGNOSIS — F419 Anxiety disorder, unspecified: Secondary | ICD-10-CM | POA: Insufficient documentation

## 2017-08-16 DIAGNOSIS — Y92007 Garden or yard of unspecified non-institutional (private) residence as the place of occurrence of the external cause: Secondary | ICD-10-CM | POA: Insufficient documentation

## 2017-08-16 DIAGNOSIS — R52 Pain, unspecified: Secondary | ICD-10-CM

## 2017-08-16 DIAGNOSIS — Z79899 Other long term (current) drug therapy: Secondary | ICD-10-CM | POA: Insufficient documentation

## 2017-08-16 DIAGNOSIS — Q7191 Unspecified reduction defect of right upper limb: Secondary | ICD-10-CM | POA: Diagnosis not present

## 2017-08-16 NOTE — ED Triage Notes (Addendum)
Pt fell when working on her pool and landed on her right wrist.  Pt's wrist appears swollen and is very painful.  EMS was called but pt suffers from panic attacks and felt it better to come via car. Pt given ice pack

## 2017-08-17 ENCOUNTER — Emergency Department (HOSPITAL_COMMUNITY): Payer: 59

## 2017-08-17 ENCOUNTER — Other Ambulatory Visit: Payer: Self-pay

## 2017-08-17 ENCOUNTER — Telehealth: Payer: Self-pay | Admitting: *Deleted

## 2017-08-17 MED ORDER — PROPOFOL 10 MG/ML IV BOLUS
INTRAVENOUS | Status: AC | PRN
  Administered 2017-08-17 (×3): 40 mg via INTRAVENOUS

## 2017-08-17 MED ORDER — MORPHINE SULFATE (PF) 4 MG/ML IV SOLN
4.0000 mg | Freq: Once | INTRAVENOUS | Status: AC
  Administered 2017-08-17: 4 mg via INTRAVENOUS
  Filled 2017-08-17: qty 1

## 2017-08-17 MED ORDER — ONDANSETRON HCL 4 MG/2ML IJ SOLN
4.0000 mg | Freq: Once | INTRAMUSCULAR | Status: AC
  Administered 2017-08-17: 4 mg via INTRAVENOUS
  Filled 2017-08-17: qty 2

## 2017-08-17 MED ORDER — PROPOFOL 10 MG/ML IV BOLUS: 40.0000 mg | Freq: Once | INTRAVENOUS | Status: DC

## 2017-08-17 MED ORDER — PROPOFOL 10 MG/ML IV BOLUS
INTRAVENOUS | Status: AC
  Filled 2017-08-17: qty 20

## 2017-08-17 MED ORDER — OXYCODONE-ACETAMINOPHEN 5-325 MG PO TABS: 1.0000 | ORAL_TABLET | ORAL | 0 refills | Status: AC | PRN

## 2017-08-17 MED ORDER — OXYCODONE-ACETAMINOPHEN 5-325 MG PO TABS
1.0000 | ORAL_TABLET | Freq: Once | ORAL | Status: AC
  Administered 2017-08-17: 1 via ORAL
  Filled 2017-08-17: qty 1

## 2017-08-17 NOTE — ED Provider Notes (Addendum)
[facility]   CSN: [id number] Arrival date & time: 08/16/17  2137     History   Chief Complaint Chief Complaint  Patient presents with  . Wrist Injury    HPI Anita Oliver is a 42 y.o. female.  HPI 42 year old Caucasian female with no pertinent past medical history except for anxiety presents to the emergency department today for evaluation of right wrist pain after mechanical injury.  Patient states that she was working in her pool when she slipped and tried to catch her fall with her right hand.  Patient reports immediate pain to the area.  Pain is worse with range of motion and palpation.  Patient denies any associated paresthesias or weakness.  She is not take anything for the pain prior to arrival.  She does report having significant anxiety and took a Xanax prior to arrival.  Patient denies any head injury or LOC.  Reports some mild pain with range of motion of the right elbow. Past Medical History:  Diagnosis Date  . Anxiety    PANICK ATTACKS   . IBS (irritable bowel syndrome)     There are no active problems to display for this patient.   Past Surgical History:  Procedure Laterality Date  . CHOLECYSTECTOMY N/A 05/24/2015   Procedure: LAPAROSCOPIC CHOLECYSTECTOMY WITH INTRAOPERATIVE CHOLANGIOGRAM;  Surgeon: Chevis PrettyPaul Toth III, MD;  Location: [facility] OR;  Service: General;  Laterality: N/A;  . NO PAST SURGERIES       OB History    Gravida  3   Para  3   Term  3   Preterm  0   AB  0   Living  3     SAB  0   TAB  0   Ectopic  0   Multiple  1   Live Births  1            Home Medications    Prior to Admission medications   Medication Sig Start Date End Date Taking? Authorizing Provider  albuterol (PROVENTIL HFA;VENTOLIN HFA) 108 (90 Base) MCG/ACT inhaler Inhale 1 puff into the lungs every 6 (six) hours as needed for wheezing or shortness of breath.   Yes [provider]  ALPRAZolam (XANAX)  0.25 MG tablet Take 0.25 mg by mouth 3 (three) times daily as needed for anxiety.    Yes [provider]  levonorgestrel-ethinyl estradiol (KURVELO) 0.15-30 MG-MCG tablet Take 1 tablet by mouth daily.   Yes [provider]  sertraline (ZOLOFT) 50 MG tablet Take 50 mg by mouth at bedtime.    Yes [provider]  oxyCODONE-acetaminophen (PERCOCET/ROXICET) 5-325 MG tablet Take 1-2 tablets by mouth every 4 (four) hours as needed for severe pain. 08/17/17   Rise MuLeaphart, Star Resler T, PA-C  traMADol (ULTRAM) 50 MG tablet Take 1-2 tablets (50-100 mg total) by mouth every 6 (six) hours as needed. Patient not taking: Reported on 08/17/2017 05/24/15   Griselda Mineroth, Paul III, MD    Family History Family History  Problem Relation Age of Onset  . Anesthesia problems Neg Hx   . Hypotension Neg Hx   . Malignant hyperthermia Neg Hx   . Pseudochol deficiency Neg Hx   . Alcohol abuse Neg Hx     Social History Social History   Tobacco Use  . Smoking status: Never Smoker  Substance Use Topics  . Alcohol use: No  . Drug use: No     Allergies   Codeine   Review  of Systems Review of Systems  All other systems reviewed and are negative.    Physical Exam Updated Vital Signs BP 129/88   Pulse 71   Temp 98.3 F (36.8 C) (Oral)   Resp 19   Ht 5\' 4"  (1.626 m)   Wt 105.2 kg (232 lb)   SpO2 100%   BMI 39.82 kg/m   Physical Exam  Constitutional: She appears well-developed and well-nourished. No distress.  HENT:  Head: Normocephalic and atraumatic.  Eyes: Right eye exhibits no discharge. Left eye exhibits no discharge. No scleral icterus.  Neck: Normal range of motion.  Pulmonary/Chest: No respiratory distress.  Musculoskeletal: Normal range of motion.  Obvious deformity to the right wrist.  There is no skin tenting.  Radial pulses are 2+ bilaterally.  Sensation intact in all dermatomes.  Brisk cap refill.  Limited range of motion of the right wrist secondary to pain.  Full range  of motion of all phalanges of the right hand.  Patient has full range of motion of the right elbow but some pain on palpation of the radial head.  Range of motion of the right shoulder without pain.  Axillary nerve intact.  Skin compartments are soft.  Neurological: She is alert.  Skin: Skin is warm and dry. Capillary refill takes less than 2 seconds. No pallor.  Psychiatric: Her behavior is normal. Judgment and thought content normal.  Nursing note and vitals reviewed.    ED Treatments / Results  Labs (all labs ordered are listed, but only abnormal results are displayed) Labs Reviewed - No data to display  EKG None  Radiology Dg Elbow 2 Views Right  Result Date: 08/17/2017 CLINICAL DATA:  Postreduction right wrist.  Right elbow pain. EXAM: RIGHT ELBOW - 2 VIEW COMPARISON:  None. FINDINGS: Cast material is present which obscures bone detail. Evaluation is also limited due to nonstandard positioning. As visualized, there is no evidence of acute fracture or dislocation in the right elbow. No definite effusion. IMPRESSION: No acute bony abnormalities demonstrated on limited examination. Electronically Signed   By: Burman Nieves M.D.   On: 08/17/2017 02:29   Dg Wrist 2 Views Right  Result Date: 08/17/2017 CLINICAL DATA:  Postreduction right wrist. EXAM: RIGHT WRIST - 2 VIEW COMPARISON:  08/16/2017 FINDINGS: Interval placement of cast material which obscures some bone detail. Comminuted fractures of the distal right radial metaphysis. Improved alignment and position of fracture fragments since previous study with near-anatomic alignment. Mild residual lateral displacement and dorsal displacement of the fracture fragments. Ulnar styloid process fracture remains mildly displaced. Position is improved. IMPRESSION: Improved alignment and position of fractures of the distal right radius and ulna post casting. Electronically Signed   By: Burman Nieves M.D.   On: 08/17/2017 02:30   Dg Wrist Complete  Right  Result Date: 08/16/2017 CLINICAL DATA:  Status post fall with right wrist pain. EXAM: RIGHT WRIST - COMPLETE 3+ VIEW COMPARISON:  None. FINDINGS: Comminuted displaced intra-articular fracture of the distal radius is identified. There is fracture of the ulna styloid. The ulna and radial shaft are displaced superiorly and ventrally relative to the right hand. IMPRESSION: Fractures of the distal ulna and radius as described. Electronically Signed   By: Sherian Rein M.D.   On: 08/16/2017 22:07    Procedures .Sedation Date/Time: 08/17/2017 7:44 AM Performed by: Rise Mu, PA-C Authorized by: Wallace Keller   Consent:    Consent obtained:  Verbal and written   Consent given by:  Patient  Risks discussed:  Allergic reaction, dysrhythmia, inadequate sedation, nausea, vomiting, respiratory compromise necessitating ventilatory assistance and intubation, prolonged sedation necessitating reversal and prolonged hypoxia resulting in organ damage   Alternatives discussed:  Analgesia without sedation Universal protocol:    Procedure explained and questions answered to patient or proxy's satisfaction: yes     Relevant documents present and verified: yes     Test results available and properly labeled: yes     Imaging studies available: yes     Immediately prior to procedure a time out was called: yes     Patient identity confirmation method:  Verbally with patient and arm band Indications:    Procedure performed:  Fracture reduction   Procedure necessitating sedation performed by:  Physician performing sedation (Dr. Bebe Shaggy)   Intended level of sedation:  Moderate (conscious sedation) Pre-sedation assessment:    Time since last food or drink:  1800   ASA classification: class 1 - normal, healthy patient     Neck mobility: normal     Mallampati score:  II - soft palate, uvula, fauces visible   Pre-sedation assessments completed and reviewed: airway patency, cardiovascular  function, hydration status, mental status, nausea/vomiting, pain level and respiratory function   Immediate pre-procedure details:    Reviewed: vital signs   Procedure details (see MAR for exact dosages):    Preoxygenation:  Nasal cannula   Sedation:  Propofol   Analgesia:  Morphine   Intra-procedure monitoring:  Blood pressure monitoring, continuous capnometry, continuous pulse oximetry, cardiac monitor, frequent vital sign checks and frequent LOC assessments   Intra-procedure events: none     Total Provider sedation time (minutes):  20 Post-procedure details:    Post-sedation assessments completed and reviewed: airway patency, cardiovascular function, hydration status, mental status, nausea/vomiting and pain level     Patient is stable for discharge or admission: yes     Patient tolerance:  Tolerated well, no immediate complications Reduction of fracture Date/Time: 08/17/2017 7:47 AM Performed by: Rise Mu, PA-C Authorized by: Rise Mu, PA-C  Consent: Verbal consent obtained. Written consent obtained. Risks and benefits: risks, benefits and alternatives were discussed Consent given by: patient Patient understanding: patient states understanding of the procedure being performed Patient consent: the patient's understanding of the procedure matches consent given Procedure consent: procedure consent matches procedure scheduled Relevant documents: relevant documents present and verified Imaging studies: imaging studies available Patient identity confirmed: verbally with patient and arm band Time out: Immediately prior to procedure a "time out" was called to verify the correct patient, procedure, equipment, support staff and site/side marked as required. Preparation: Patient was prepped and draped in the usual sterile fashion.  Sedation: Patient sedated: yes Sedatives: propofol (120mg ) Analgesia: morphine Vitals: Vital signs were monitored during sedation.  Patient  tolerance: Patient tolerated the procedure well with no immediate complications Comments: Distal radial and ulna fracture was reduced given the significant displacement and angulation.  Patient remained neurovascularly intact following the reduction.  Splint was placed.  Postreduction films were obtained that showed improvement in patient's reduction.    (including critical care time)  Medications Ordered in ED Medications  ondansetron (ZOFRAN) injection 4 mg (4 mg Intravenous Given 08/17/17 0132)  morphine 4 MG/ML injection 4 mg (4 mg Intravenous Given 08/17/17 0132)  propofol (DIPRIVAN) 10 mg/mL bolus/IV push (40 mg Intravenous Given 08/17/17 0206)  oxyCODONE-acetaminophen (PERCOCET/ROXICET) 5-325 MG per tablet 1 tablet (1 tablet Oral Given 08/17/17 0404)     Initial Impression / Assessment and Plan / ED  Course  I have reviewed the triage vital signs and the nursing notes.  Pertinent labs & imaging results that were available during my care of the patient were reviewed by me and considered in my medical decision making (see chart for details).     Patient presents the ED with right wrist pain after mechanical injury.  She has a distal comminuted and displaced radius and ulna fracture.  Patient is neurovascularly intact.  I spoke with Dr. Sena Slate with hand surgery who recommends reduction and splinting will see patient in the office tomorrow morning.  Conscious sedation was performed with Dr. Bebe Shaggy at bedside.  Conscious sedation with reduction was performed without any complications and postreduction x-rays revealed improved alignment of patient's radius and ulna.  Patient remained neurovascularly intact.  Sugar tong splint was placed.  Pain controlled in the ED.  Patient was watched for approximately 1.5 hours following procedure with no further complications.  Patient will be discharged with pain medication and given follow-up with hand surgery.  Patient was evaluated by my attending who also  help perform sedation and reduction.  Pt is hemodynamically stable, in NAD, & able to ambulate in the ED. Evaluation does not show pathology that would require ongoing emergent intervention or inpatient treatment. I explained the diagnosis to the patient. Pain has been managed & has no complaints prior to dc. Pt is comfortable with above plan and is stable for discharge at this time. All questions were answered prior to disposition. Strict return precautions for f/u to the ED were discussed. Encouraged follow up with PCP.   Final Clinical Impressions(s) / ED Diagnoses   Final diagnoses:  Reduction deformity of upper limb, right  Closed fracture of right wrist, initial encounter    ED Discharge Orders        Ordered    oxyCODONE-acetaminophen (PERCOCET/ROXICET) 5-325 MG tablet  Every 4 hours PRN     08/17/17 0359       Rise Mu, PA-C 08/17/17 0749    Rise Mu, PA-C 08/17/17 0750    Zadie Rhine, MD 08/19/17 [phone]

## 2017-08-17 NOTE — Progress Notes (Signed)
RT present for conscious sedation at this time with end tidal placed and monitoring.

## 2017-08-17 NOTE — Telephone Encounter (Signed)
Pharmacy called related to Rx:  oxyCODONE-acetaminophen (PERCOCET/ROXICET) 5-325 MG tablet 1-2 tablet, Every 4 hours PRN    .Marland Kitchen.Marland Kitchen.EDCM clarified with EDP (Hedges) to change Rx directions to: 1 tablet every 4 hours PRN pain.

## 2017-08-17 NOTE — ED Provider Notes (Signed)
Patient seen/examined in the Emergency Department in conjunction with Midlevel Provider  Patient reports fall while working on a pulling on her right wrist.  She has pain right wrist.  No other acute injuries Exam : Awake alert, anxious Obvious deformity to right wrist.  Pulses intact, no lacerations Mild tenderness to right elbow. Plan: Patient prefers to be sedated to have reduction of distal radius fracture   Zadie RhineWickline, Elisse Pennick, MD 08/17/17 0121

## 2017-08-17 NOTE — Progress Notes (Signed)
Conscious sedation procedure complete and PT VS acceptable throughout procedure.  RT will monitor as necessary.

## 2017-08-17 NOTE — Discharge Instructions (Signed)
Percocet as needed for pain. Ice affected area (see instructions below).  °Please call the orthopedic physician listed today or first thing in the morning to schedule a follow up appointment.  ° °Fractures generally take 4-6 weeks to heal. It is very important to keep your splint dry until your follow up with the orthopedic doctor and a cast can be applied. You may place a plastic bag around the extremity with the splint while bathing to keep it dry. Also try to sleep with the extremity elevated for the next several nights to decrease swelling. Check the fingertips and toes several times per day to make sure they are not cold, pale, or blue. If this is the case, the splint may be too tight and should return to the ER, your regular doctor or the orthopedist for recheck. Return to the ER for new or worsening symptoms, any additional concerns.  ° °COLD THERAPY DIRECTIONS:  °Ice or gel packs can be used to reduce both pain and swelling. Ice is the most helpful within the first 24 to 48 hours after an injury or flareup from overusing a muscle or joint.  Ice is effective, has very few side effects, and is safe for most people to use.  ° °If you expose your skin to cold temperatures for too long or without the proper protection, you can damage your skin or nerves. Watch for signs of skin damage due to cold.  ° °HOME CARE INSTRUCTIONS  °Follow these tips to use ice and cold packs safely.  °Place a dry or damp towel between the ice and skin. A damp towel will cool the skin more quickly, so you may need to shorten the time that the ice is used.  °For a more rapid response, add gentle compression to the ice.  °Ice for no more than 10 to 20 minutes at a time. The bonier the area you are icing, the less time it will take to get the benefits of ice.  °Check your skin after 5 minutes to make sure there are no signs of a poor response to cold or skin damage.  °Rest 20 minutes or more in between uses.  °Once your skin is numb, you  can end your treatment. You can test numbness by very lightly touching your skin. The touch should be so light that you do not see the skin dimple from the pressure of your fingertip. When using ice, most people will feel these normal sensations in this order: cold, burning, aching, and numbness.  ° °

## 2018-12-22 IMAGING — DX DG ELBOW 2V*R*
1 series · 2 of 2 positions shown · non-contrast
Comparison: None.

CLINICAL DATA: Postreduction right wrist.  Right elbow pain.

EXAM:
RIGHT ELBOW - 2 VIEW

[Series 1: elbow · 0.14mm/px · 2 of 2 slices shown]
[im 1/2]
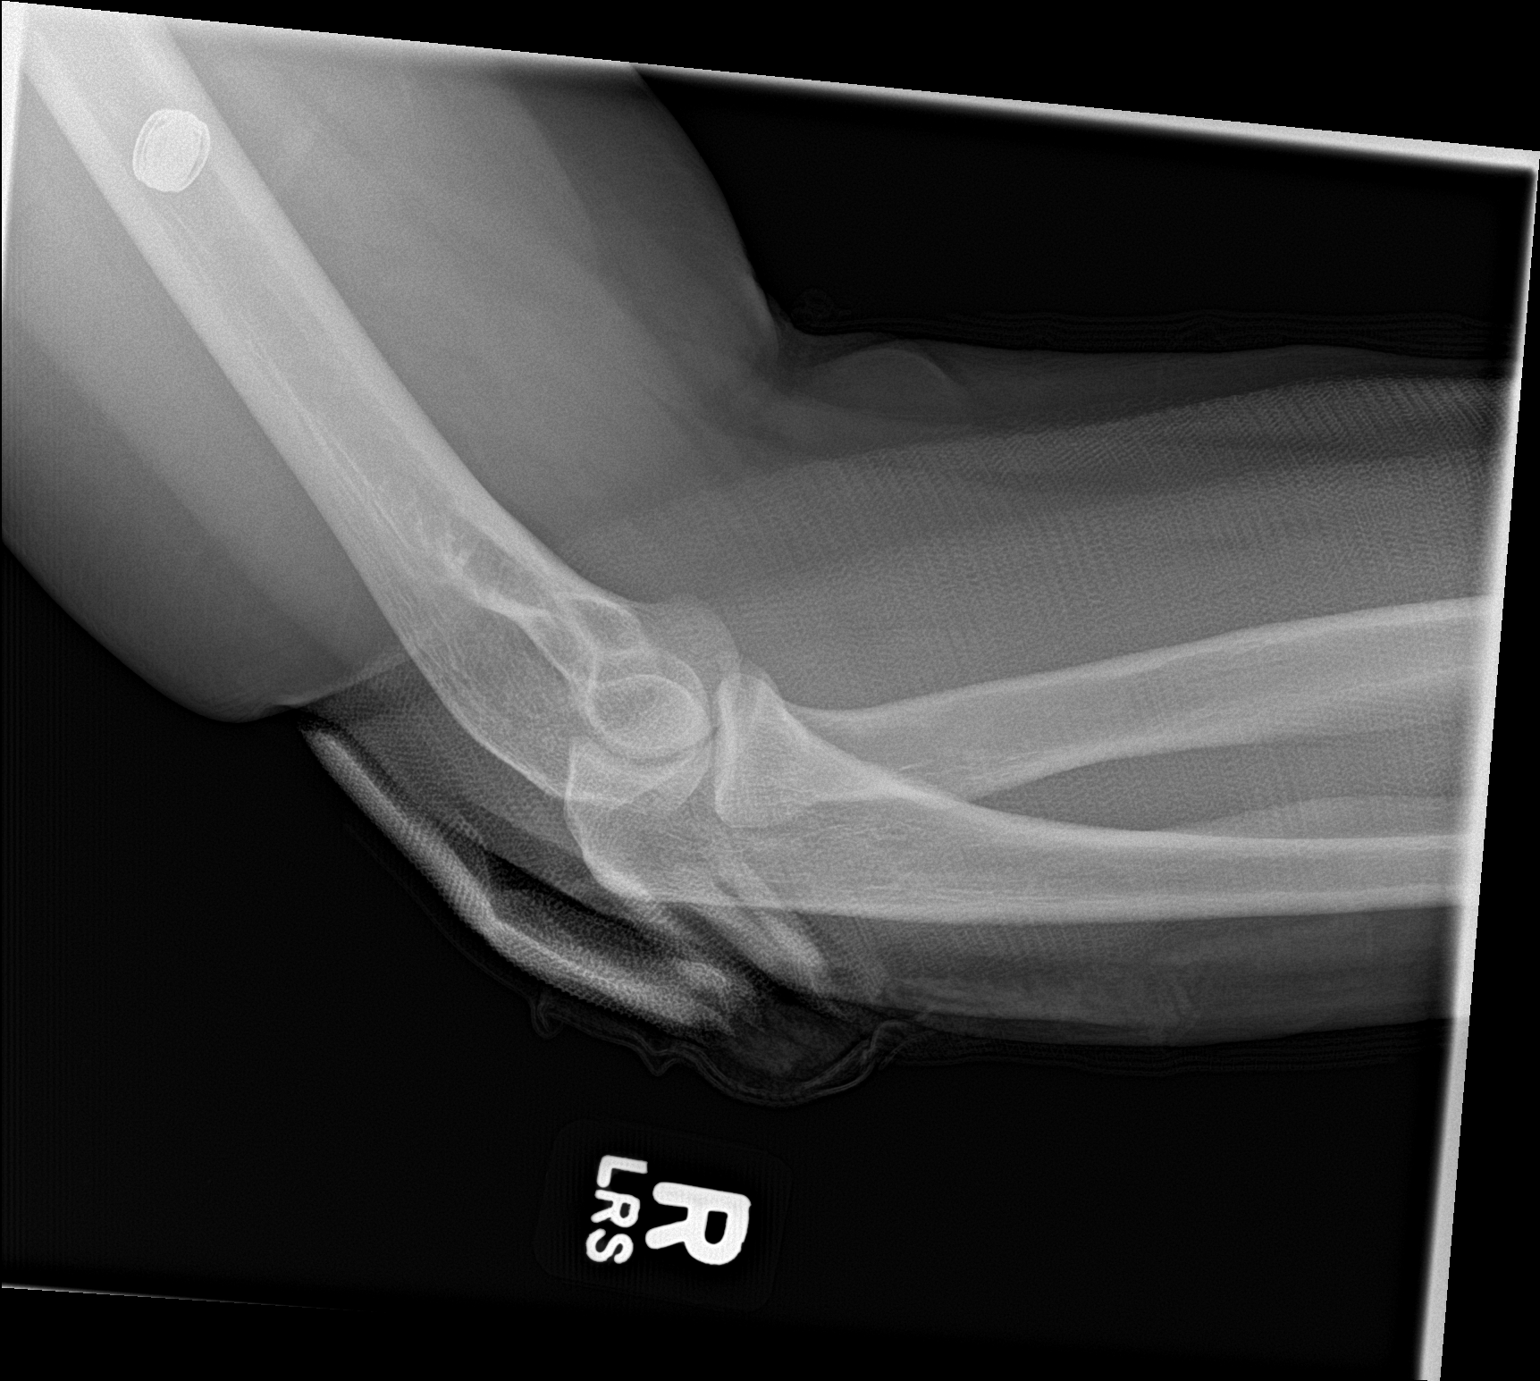
[im 2/2]
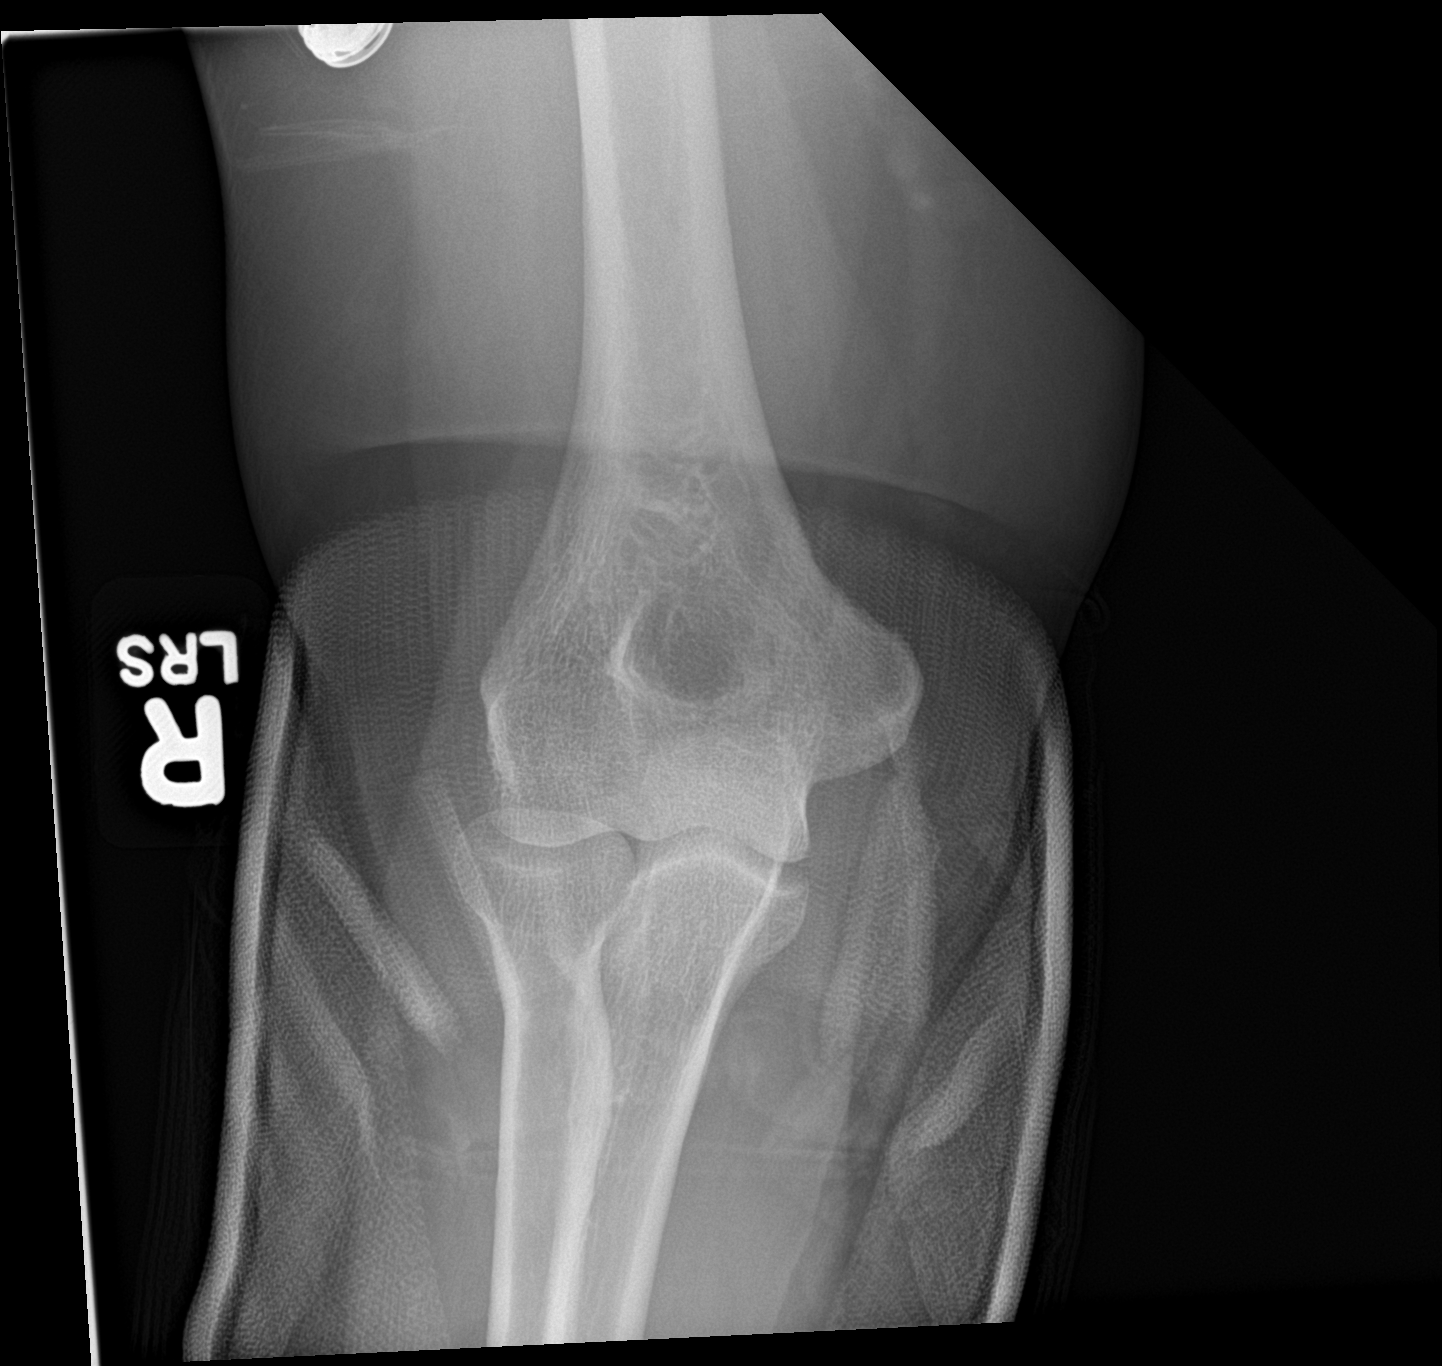

[2 of 2 positions shown; findings below may reference images not displayed]

FINDINGS: Cast material is present which obscures bone detail. Evaluation is
also limited due to nonstandard positioning. As visualized, there is
no evidence of acute fracture or dislocation in the right elbow. No
definite effusion.
IMPRESSION: No acute bony abnormalities demonstrated on limited examination.
